# Patient Record
Sex: Male | Born: 2000 | Race: White | Hispanic: No | Marital: Single | Smoking: Never smoker
Health system: Southern US, Community
[De-identification: ages and names within clinical notes are randomized; demographics above are authoritative.]

## PROBLEM LIST (undated history)

## (undated) DIAGNOSIS — L709 Acne, unspecified: Secondary | ICD-10-CM

## (undated) HISTORY — DX: Acne, unspecified: L70.9

---
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
## 2023-04-26 ENCOUNTER — Emergency Department (HOSPITAL_BASED_OUTPATIENT_CLINIC_OR_DEPARTMENT_OTHER)
Admission: EM | Admit: 2023-04-26 | Discharge: 2023-04-26 | Disposition: A | Discharge status: Home / Self Care | Payer: Managed Care, Other (non HMO) | Attending: Emergency Medicine | Admitting: Emergency Medicine

## 2023-04-26 ENCOUNTER — Encounter (HOSPITAL_BASED_OUTPATIENT_CLINIC_OR_DEPARTMENT_OTHER): Payer: Self-pay

## 2023-04-26 ENCOUNTER — Emergency Department (HOSPITAL_BASED_OUTPATIENT_CLINIC_OR_DEPARTMENT_OTHER): Payer: Managed Care, Other (non HMO)

## 2023-04-26 ENCOUNTER — Other Ambulatory Visit: Payer: Self-pay

## 2023-04-26 DIAGNOSIS — S61215A Laceration without foreign body of left ring finger without damage to nail, initial encounter: Secondary | ICD-10-CM | POA: Insufficient documentation

## 2023-04-26 DIAGNOSIS — W260XXA Contact with knife, initial encounter: Secondary | ICD-10-CM | POA: Insufficient documentation

## 2023-04-26 DIAGNOSIS — Z23 Encounter for immunization: Secondary | ICD-10-CM | POA: Diagnosis not present

## 2023-04-26 DIAGNOSIS — Y93G1 Activity, food preparation and clean up: Secondary | ICD-10-CM | POA: Insufficient documentation

## 2023-04-26 MED ORDER — TETANUS-DIPHTH-ACELL PERTUSSIS 5-2.5-18.5 LF-MCG/0.5 IM SUSY
0.5000 mL | PREFILLED_SYRINGE | Freq: Once | INTRAMUSCULAR | Status: AC
  Administered 2023-04-26: 0.5 mL via INTRAMUSCULAR
  Filled 2023-04-26: qty 0.5

## 2023-04-26 NOTE — ED Notes (Signed)

## 2023-04-26 NOTE — ED Notes (Addendum)
Cannot discharge, registration in chart.   Prior smart text was incorrect. Ignore please.

## 2023-04-26 NOTE — ED Provider Notes (Signed)
[place] [facility] Provider Note   CSN: [id number] Arrival date & time: 04/26/23  1745     History  Chief Complaint  Patient presents with   Finger Injury    Kenneth Haas is a 22 y.o. male with overall noncontributory past medical history, who is unsure when his last tetanus shot was presents concern for left ring finger laceration just prior to arrival while cutting potatoes.  Patient reports that it was still bleeding at time of arrival in the ED.  He denies any other injuries.  HPI     Home Medications Prior to Admission medications   Medication Sig Start Date End Date Taking? Authorizing Provider  neomycin-polymyxin-dexameth (MAXITROL) 0.1 % OINT Place 1 application into both eyes.    [provider]  sulfamethoxazole-trimethoprim (BACTRIM DS,SEPTRA DS) 800-160 MG tablet Take 1 tablet by mouth 2 (two) times daily.    [provider]      Allergies    Patient has no known allergies.    Review of Systems   Review of Systems  All other systems reviewed and are negative.   Physical Exam Updated Vital Signs BP (!) 155/95 (BP Location: Right Arm)   Pulse 89   Temp 98.3 F (36.8 C) (Oral)   Resp 16   Wt 90.7 kg   SpO2 100%   BMI 25.68 kg/m  Physical Exam Vitals and nursing note reviewed.  Constitutional:      General: He is not in acute distress.    Appearance: Normal appearance.  HENT:     Head: Normocephalic and atraumatic.  Eyes:     General:        Right eye: No discharge.        Left eye: No discharge.  Cardiovascular:     Rate and Rhythm: Normal rate and regular rhythm.  Pulmonary:     Effort: Pulmonary effort is normal. No respiratory distress.  Musculoskeletal:        General: No deformity.  Skin:    General: Skin is warm and dry.     Comments: Extremely distal laceration of the left ring finger not actively bleeding on initial evaluation but with return of bleeding after minor  manipulation.  The laceration is distal to the nailbed, there is no damage to the nailbed, and the laceration is overall fairly shallow.  No foreign body noted.  Neurological:     Mental Status: He is alert and oriented to person, place, and time.  Psychiatric:        Mood and Affect: Mood normal.        Behavior: Behavior normal.     ED Results / Procedures / Treatments   Labs (all labs ordered are listed, but only abnormal results are displayed) Labs Reviewed - No data to display  EKG None  Radiology No results found.  Procedures Procedures    Medications Ordered in ED Medications  Tdap (BOOSTRIX) injection 0.5 mL (0.5 mLs Intramuscular Given 04/26/23 1929)    ED Course/ Medical Decision Making/ A&P                                 Medical Decision Making Amount and/or Complexity of Data Reviewed Radiology: ordered.  Risk Prescription drug management.    This is an overall well-appearing 22 year old male who presents with a laceration of the left ring finger.  The wound was explored through full range  of motion, there is no evidence of foreign body, fascia rupture, damage to the deep vessels, capillary refill is intact distal to the site of the laceration.  Patient is not up-to-date on their tetanus, tetanus updated today..  They are neurovascularly intact throughout on my exam, intact strength of the affected extremity.   Wound was extensively cleaned, and repaired as described above with Dermabond.  Patient tolerated the procedure without difficulty.  Patient instructed to monitor for signs of infection including worsening pain, redness, pus draining from the affected site.  Instructed to keep the wound clean, bandage, and change the bandage at least once daily.  Patient understands and agrees to the plan, and is discharged in stable condition at this time.  Final Clinical Impression(s) / ED Diagnoses Final diagnoses:  Laceration of left ring finger without foreign  body without damage to nail, initial encounter    Rx / DC Orders ED Discharge Orders     None         [place] 04/26/23 1947    Terrilee Files, MD 04/27/23 1020

## 2023-04-26 NOTE — Discharge Instructions (Signed)
Please monitor for any signs of infection including worsening pain, redness, swelling, pus draining from the affected site.  Try to keep it clean and dry, it is okay to wash your hands like normal but note that excessive scrubbing of the site may cause the glue to come off prior to the wound completely healing.

## 2023-04-26 NOTE — ED Triage Notes (Signed)
Pt arrives with c/o left ring finger laceration. Pt was cutting potatoes when he cut himself with a knife. Laceration is to the tip of his left ring finer and is still bleeding, dressing was applied in triage.
